# Patient Record
Sex: Male | Born: 1990 | Race: Black or African American | Hispanic: No | Marital: Single | Smoking: Current every day smoker
Health system: Southern US, Community
[De-identification: ages and names within clinical notes are randomized; demographics above are authoritative.]

---

## 2011-12-24 ENCOUNTER — Encounter (HOSPITAL_COMMUNITY): Payer: Self-pay

## 2011-12-24 ENCOUNTER — Emergency Department (HOSPITAL_COMMUNITY)
Admission: EM | Admit: 2011-12-24 | Discharge: 2011-12-24 | Disposition: A | Discharge status: Home / Self Care | Payer: BC Managed Care – PPO | Attending: Emergency Medicine | Admitting: Emergency Medicine

## 2011-12-24 ENCOUNTER — Emergency Department (HOSPITAL_COMMUNITY): Payer: BC Managed Care – PPO

## 2011-12-24 DIAGNOSIS — IMO0002 Reserved for concepts with insufficient information to code with codable children: Secondary | ICD-10-CM | POA: Insufficient documentation

## 2011-12-24 DIAGNOSIS — S40819A Abrasion of unspecified upper arm, initial encounter: Secondary | ICD-10-CM

## 2011-12-24 DIAGNOSIS — W19XXXA Unspecified fall, initial encounter: Secondary | ICD-10-CM

## 2011-12-24 DIAGNOSIS — F17200 Nicotine dependence, unspecified, uncomplicated: Secondary | ICD-10-CM | POA: Insufficient documentation

## 2011-12-24 MED ORDER — DIPHTH-ACELL PERTUSSIS-TETANUS 25-58-10 LF-MCG/0.5 IM SUSP: 0.5000 mL | Freq: Once | INTRAMUSCULAR | Status: DC

## 2011-12-24 MED ORDER — TETANUS-DIPHTH-ACELL PERTUSSIS 5-2.5-18.5 LF-MCG/0.5 IM SUSP
0.5000 mL | Freq: Once | INTRAMUSCULAR | Status: AC
  Administered 2011-12-24: 0.5 mL via INTRAMUSCULAR
  Filled 2011-12-24: qty 0.5

## 2011-12-24 MED ORDER — BACITRACIN ZINC 500 UNIT/GM EX OINT
TOPICAL_OINTMENT | CUTANEOUS | Status: AC
  Administered 2011-12-24: 15:00:00
  Filled 2011-12-24: qty 2.7

## 2011-12-24 NOTE — ED Notes (Signed)
Abrasions dressed with bacitracin/telfa/4x4/kling and tape.
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
## 2014-11-18 ENCOUNTER — Emergency Department (HOSPITAL_COMMUNITY): Payer: Self-pay

## 2014-11-18 ENCOUNTER — Emergency Department (HOSPITAL_COMMUNITY)
Admission: EM | Admit: 2014-11-18 | Discharge: 2014-11-18 | Disposition: A | Discharge status: Home / Self Care | Payer: Self-pay | Attending: Emergency Medicine | Admitting: Emergency Medicine

## 2014-11-18 ENCOUNTER — Encounter (HOSPITAL_COMMUNITY): Payer: Self-pay

## 2014-11-18 DIAGNOSIS — F1210 Cannabis abuse, uncomplicated: Secondary | ICD-10-CM | POA: Insufficient documentation

## 2014-11-18 DIAGNOSIS — F10120 Alcohol abuse with intoxication, uncomplicated: Secondary | ICD-10-CM | POA: Insufficient documentation

## 2014-11-18 DIAGNOSIS — Z720 Tobacco use: Secondary | ICD-10-CM | POA: Insufficient documentation

## 2014-11-18 DIAGNOSIS — Y9389 Activity, other specified: Secondary | ICD-10-CM | POA: Insufficient documentation

## 2014-11-18 DIAGNOSIS — R404 Transient alteration of awareness: Secondary | ICD-10-CM

## 2014-11-18 DIAGNOSIS — Y998 Other external cause status: Secondary | ICD-10-CM | POA: Insufficient documentation

## 2014-11-18 DIAGNOSIS — F10920 Alcohol use, unspecified with intoxication, uncomplicated: Secondary | ICD-10-CM

## 2014-11-18 DIAGNOSIS — Y92008 Other place in unspecified non-institutional (private) residence as the place of occurrence of the external cause: Secondary | ICD-10-CM | POA: Insufficient documentation

## 2014-11-18 DIAGNOSIS — S0081XA Abrasion of other part of head, initial encounter: Secondary | ICD-10-CM | POA: Insufficient documentation

## 2014-11-18 LAB — RAPID URINE DRUG SCREEN, HOSP PERFORMED
Amphetamines: NOT DETECTED
Barbiturates: NOT DETECTED
Benzodiazepines: NOT DETECTED
COCAINE: NOT DETECTED
OPIATES: NOT DETECTED
Tetrahydrocannabinol: POSITIVE — AB

## 2014-11-18 LAB — CBC WITH DIFFERENTIAL/PLATELET
BASOS ABS: 0 10*3/uL (ref 0.0–0.1)
BASOS PCT: 0 % (ref 0–1)
EOS ABS: 0 10*3/uL (ref 0.0–0.7)
EOS PCT: 0 % (ref 0–5)
HEMATOCRIT: 40.2 % (ref 39.0–52.0)
HEMOGLOBIN: 13.6 g/dL (ref 13.0–17.0)
Lymphocytes Relative: 15 % (ref 12–46)
Lymphs Abs: 1.4 10*3/uL (ref 0.7–4.0)
MCH: 24.2 pg — AB (ref 26.0–34.0)
MCHC: 33.8 g/dL (ref 30.0–36.0)
MCV: 71.7 fL — ABNORMAL LOW (ref 78.0–100.0)
Monocytes Absolute: 0.7 10*3/uL (ref 0.1–1.0)
Monocytes Relative: 7 % (ref 3–12)
Neutro Abs: 7.2 10*3/uL (ref 1.7–7.7)
Neutrophils Relative %: 77 % (ref 43–77)
PLATELETS: 303 10*3/uL (ref 150–400)
RBC: 5.61 MIL/uL (ref 4.22–5.81)
RDW: 14.8 % (ref 11.5–15.5)
WBC: 9.3 10*3/uL (ref 4.0–10.5)

## 2014-11-18 LAB — COMPREHENSIVE METABOLIC PANEL
ALBUMIN: 4.5 g/dL (ref 3.5–5.0)
ALK PHOS: 49 U/L (ref 38–126)
ALT: 16 U/L — ABNORMAL LOW (ref 17–63)
AST: 20 U/L (ref 15–41)
Anion gap: 9 (ref 5–15)
BILIRUBIN TOTAL: 0.3 mg/dL (ref 0.3–1.2)
BUN: 12 mg/dL (ref 6–20)
CO2: 27 mmol/L (ref 22–32)
Calcium: 9.1 mg/dL (ref 8.9–10.3)
Chloride: 105 mmol/L (ref 101–111)
Creatinine, Ser: 1.1 mg/dL (ref 0.61–1.24)
GLUCOSE: 99 mg/dL (ref 65–99)
Potassium: 3.3 mmol/L — ABNORMAL LOW (ref 3.5–5.1)
SODIUM: 141 mmol/L (ref 135–145)
Total Protein: 7.2 g/dL (ref 6.5–8.1)

## 2014-11-18 LAB — URINALYSIS, ROUTINE W REFLEX MICROSCOPIC
Bilirubin Urine: NEGATIVE
Glucose, UA: NEGATIVE mg/dL
HGB URINE DIPSTICK: NEGATIVE
KETONES UR: NEGATIVE mg/dL
LEUKOCYTES UA: NEGATIVE
NITRITE: NEGATIVE
PH: 6 (ref 5.0–8.0)
PROTEIN: NEGATIVE mg/dL
Specific Gravity, Urine: <1.005 — ABNORMAL LOW (ref 1.005–1.030)
Urobilinogen, UA: 0.2 mg/dL (ref 0.0–1.0)

## 2014-11-18 LAB — ETHANOL: Alcohol, Ethyl (B): 191 mg/dL — ABNORMAL HIGH (ref <5)

## 2014-11-18 NOTE — ED Notes (Signed)
Patient alert, getting dressed, patient call family for p/u

## 2014-11-18 NOTE — ED Provider Notes (Signed)
Patient is alert and ambulatory. No psychosis. No neuro deficits.  Donnetta Hutching, MD 11/18/14 1044

## 2014-11-18 NOTE — ED Notes (Signed)
Family at bedside. Patient asleep and snoring at this time.
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
## 2014-11-18 NOTE — ED Notes (Signed)
Patient lying on bed, eyes closed, easily aroused by touch.

## 2014-11-18 NOTE — ED Notes (Signed)
Patient ambulated around room without assistant, patient was able to tolerate one cup of water

## 2014-11-18 NOTE — ED Notes (Signed)
Father left phone number for Stepmother Merrilee Jansky [phone]) to be called once patient is alert and ready for pick-up.

## 2014-11-18 NOTE — Discharge Instructions (Signed)
Alcohol Intoxication  Alcohol intoxication occurs when the amount of alcohol that a person has consumed impairs his or her ability to mentally and physically function. Alcohol directly impairs the normal chemical activity of the brain. Drinking large amounts of alcohol can lead to changes in mental function and behavior, and it can cause many physical effects that can be harmful.   Alcohol intoxication can range in severity from mild to very severe. Various factors can affect the level of intoxication that occurs, such as the person's age, gender, weight, frequency of alcohol consumption, and the presence of other medical conditions (such as diabetes, seizures, or heart conditions). Dangerous levels of alcohol intoxication may occur when people drink large amounts of alcohol in a short period (binge drinking). Alcohol can also be especially dangerous when combined with certain prescription medicines or "recreational" drugs.  SIGNS AND SYMPTOMS  Some common signs and symptoms of mild alcohol intoxication include:  · Loss of coordination.  · Changes in mood and behavior.  · Impaired judgment.  · Slurred speech.  As alcohol intoxication progresses to more severe levels, other signs and symptoms will appear. These may include:  · Vomiting.  · Confusion and impaired memory.  · Slowed breathing.  · Seizures.  · Loss of consciousness.  DIAGNOSIS   Your health care provider will take a medical history and perform a physical exam. You will be asked about the amount and type of alcohol you have consumed. Blood tests will be done to measure the concentration of alcohol in your blood. In many places, your blood alcohol level must be lower than 80 mg/dL (0.08%) to legally drive. However, many dangerous effects of alcohol can occur at much lower levels.   TREATMENT   People with alcohol intoxication often do not require treatment. Most of the effects of alcohol intoxication are temporary, and they go away as the alcohol naturally  leaves the body. Your health care provider will monitor your condition until you are stable enough to go home. Fluids are sometimes given through an IV access tube to help prevent dehydration.   HOME CARE INSTRUCTIONS  · Do not drive after drinking alcohol.  · Stay hydrated. Drink enough water and fluids to keep your urine clear or pale yellow. Avoid caffeine.    · Only take over-the-counter or prescription medicines as directed by your health care provider.    SEEK MEDICAL CARE IF:   · You have persistent vomiting.    · You do not feel better after a few days.  · You have frequent alcohol intoxication. Your health care provider can help determine if you should see a substance use treatment counselor.  SEEK IMMEDIATE MEDICAL CARE IF:   · You become shaky or tremble when you try to stop drinking.    · You shake uncontrollably (seizure).    · You throw up (vomit) blood. This may be bright red or may look like black coffee grounds.    · You have blood in your stool. This may be bright red or may appear as a black, tarry, bad smelling stool.    · You become lightheaded or faint.    MAKE SURE YOU:   · Understand these instructions.  · Will watch your condition.  · Will get help right away if you are not doing well or get worse.  Document Released: 03/06/2005 Document Revised: 01/27/2013 Document Reviewed: 10/30/2012  [employer]® Patient Information ©2015 [employer]. This information is not intended to replace advice given to you by your health care provider. Make sure   you discuss any questions you have with your health care provider.
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
## 2016-03-27 ENCOUNTER — Encounter (HOSPITAL_COMMUNITY): Payer: Self-pay

## 2016-03-27 ENCOUNTER — Emergency Department (HOSPITAL_COMMUNITY)
Admission: EM | Admit: 2016-03-27 | Discharge: 2016-03-27 | Disposition: A | Discharge status: Home / Self Care | Payer: Self-pay | Attending: Emergency Medicine | Admitting: Emergency Medicine

## 2016-03-27 ENCOUNTER — Emergency Department (HOSPITAL_COMMUNITY): Payer: Self-pay

## 2016-03-27 DIAGNOSIS — R109 Unspecified abdominal pain: Secondary | ICD-10-CM

## 2016-03-27 DIAGNOSIS — F17200 Nicotine dependence, unspecified, uncomplicated: Secondary | ICD-10-CM | POA: Insufficient documentation

## 2016-03-27 DIAGNOSIS — E876 Hypokalemia: Secondary | ICD-10-CM | POA: Insufficient documentation

## 2016-03-27 DIAGNOSIS — R1084 Generalized abdominal pain: Secondary | ICD-10-CM | POA: Insufficient documentation

## 2016-03-27 LAB — CBC WITH DIFFERENTIAL/PLATELET
BASOS ABS: 0 10*3/uL (ref 0.0–0.1)
BASOS PCT: 1 %
EOS ABS: 0.2 10*3/uL (ref 0.0–0.7)
Eosinophils Relative: 3 %
HCT: 41.6 % (ref 39.0–52.0)
HEMOGLOBIN: 13.6 g/dL (ref 13.0–17.0)
Lymphocytes Relative: 31 %
Lymphs Abs: 1.9 10*3/uL (ref 0.7–4.0)
MCH: 23.9 pg — ABNORMAL LOW (ref 26.0–34.0)
MCHC: 32.7 g/dL (ref 30.0–36.0)
MCV: 73.1 fL — ABNORMAL LOW (ref 78.0–100.0)
Monocytes Absolute: 0.7 10*3/uL (ref 0.1–1.0)
Monocytes Relative: 11 %
NEUTROS PCT: 54 %
Neutro Abs: 3.3 10*3/uL (ref 1.7–7.7)
PLATELETS: 223 10*3/uL (ref 150–400)
RBC: 5.69 MIL/uL (ref 4.22–5.81)
RDW: 15.1 % (ref 11.5–15.5)
WBC: 6.1 10*3/uL (ref 4.0–10.5)

## 2016-03-27 LAB — URINALYSIS, ROUTINE W REFLEX MICROSCOPIC
Bilirubin Urine: NEGATIVE
GLUCOSE, UA: NEGATIVE mg/dL
HGB URINE DIPSTICK: NEGATIVE
Ketones, ur: NEGATIVE mg/dL
Leukocytes, UA: NEGATIVE
Nitrite: NEGATIVE
PROTEIN: NEGATIVE mg/dL
Specific Gravity, Urine: <1.005 — ABNORMAL LOW (ref 1.005–1.030)
pH: 6 (ref 5.0–8.0)

## 2016-03-27 LAB — COMPREHENSIVE METABOLIC PANEL
ALK PHOS: 57 U/L (ref 38–126)
ALT: 15 U/L — AB (ref 17–63)
AST: 22 U/L (ref 15–41)
Albumin: 4.7 g/dL (ref 3.5–5.0)
Anion gap: 7 (ref 5–15)
BILIRUBIN TOTAL: 0.8 mg/dL (ref 0.3–1.2)
BUN: 15 mg/dL (ref 6–20)
CALCIUM: 9.4 mg/dL (ref 8.9–10.3)
CHLORIDE: 101 mmol/L (ref 101–111)
CO2: 29 mmol/L (ref 22–32)
CREATININE: 1.23 mg/dL (ref 0.61–1.24)
Glucose, Bld: 83 mg/dL (ref 65–99)
Potassium: 3.8 mmol/L (ref 3.5–5.1)
Sodium: 137 mmol/L (ref 135–145)
TOTAL PROTEIN: 7.7 g/dL (ref 6.5–8.1)

## 2016-03-27 LAB — LIPASE, BLOOD: LIPASE: 22 U/L (ref 11–51)

## 2016-03-27 MED ORDER — IOPAMIDOL (ISOVUE-300) INJECTION 61%
100.0000 mL | Freq: Once | INTRAVENOUS | Status: AC | PRN
  Administered 2016-03-27: 100 mL via INTRAVENOUS

## 2016-03-27 MED ORDER — FAMOTIDINE IN NACL 20-0.9 MG/50ML-% IV SOLN
20.0000 mg | Freq: Once | INTRAVENOUS | Status: AC
  Administered 2016-03-27: 20 mg via INTRAVENOUS
  Filled 2016-03-27: qty 50

## 2016-03-27 MED ORDER — DICYCLOMINE HCL 10 MG/ML IM SOLN
20.0000 mg | Freq: Once | INTRAMUSCULAR | Status: AC
  Administered 2016-03-27: 20 mg via INTRAMUSCULAR
  Filled 2016-03-27: qty 2

## 2016-03-27 MED ORDER — DICYCLOMINE HCL 20 MG PO TABS: 20.0000 mg | ORAL_TABLET | Freq: Four times a day (QID) | ORAL | 0 refills | Status: AC | PRN

## 2016-03-27 MED ORDER — POTASSIUM CHLORIDE CRYS ER 20 MEQ PO TBCR
40.0000 meq | EXTENDED_RELEASE_TABLET | Freq: Once | ORAL | Status: AC
  Administered 2016-03-27: 40 meq via ORAL
  Filled 2016-03-27: qty 2

## 2016-03-27 MED ORDER — ONDANSETRON HCL 4 MG PO TABS: 4.0000 mg | ORAL_TABLET | Freq: Three times a day (TID) | ORAL | 0 refills | Status: AC | PRN

## 2016-03-27 NOTE — ED Provider Notes (Signed)
[facility]-EMERGENCY DEPT Provider Note   CSN: [id number] Arrival date & time: 03/27/16  1303     History   Chief Complaint Chief Complaint  Patient presents with  . Abdominal Pain    HPI Benjamin Henry is a 25 y.o. male.  HPI  Pt was seen at 1335.  Per pt, c/o gradual onset and persistence of constant generalized abd "pain" that began overnight last night.  Has been associated with no other symptoms.  Describes the abd pain as "cramping."  Denies N/V, no diarrhea, no fevers, no back pain, no rash, no CP/SOB, no black or blood in stools, no dysuria/hematuria, no testicular pain/swelling.      History reviewed. No pertinent past medical history.  There are no active problems to display for this patient.   History reviewed. No pertinent surgical history.     Home Medications    Prior to Admission medications   Not on File    Family History History reviewed. No pertinent family history.  Social History Social History  Substance Use Topics  . Smoking status: Current Every Day Smoker  . Smokeless tobacco: Not on file  . Alcohol use Yes     Comment: occ     Allergies   Tylenol [acetaminophen]   Review of Systems Review of Systems ROS: Statement: All systems negative except as marked or noted in the HPI; Constitutional: Negative for fever and chills. ; ; Eyes: Negative for eye pain, redness and discharge. ; ; ENMT: Negative for ear pain, hoarseness, nasal congestion, sinus pressure and sore throat. ; ; Cardiovascular: Negative for chest pain, palpitations, diaphoresis, dyspnea and peripheral edema. ; ; Respiratory: Negative for cough, wheezing and stridor. ; ; Gastrointestinal: +abd pain. Negative for nausea, vomiting, diarrhea, blood in stool, hematemesis, jaundice and rectal bleeding. . ; ; Genitourinary: Negative for dysuria, flank pain and hematuria. ; ; Musculoskeletal: Negative for back pain and neck pain. Negative for swelling and trauma.; ; Skin: Negative for pruritus,  rash, abrasions, blisters, bruising and skin lesion.; ; Neuro: Negative for headache, lightheadedness and neck stiffness. Negative for weakness, altered level of consciousness, altered mental status, extremity weakness, paresthesias, involuntary movement, seizure and syncope.      Physical Exam Updated Vital Signs BP 113/64 (BP Location: Left Arm)   Pulse 74   Temp 98.5 F (36.9 C) (Oral)   Resp 16   Ht 5\' 7"  (1.702 m)   Wt 160 lb (72.6 kg)   SpO2 100%   BMI 25.06 kg/m   Physical Exam 1340: Physical examination:  Nursing notes reviewed; Vital signs and O2 SAT reviewed;  Constitutional: Well developed, Well nourished, Well hydrated, In no acute distress; Head:  Normocephalic, atraumatic; Eyes: EOMI, PERRL, No scleral icterus; ENMT: Mouth and pharynx normal, Mucous membranes moist; Neck: Supple, Full range of motion, No lymphadenopathy; Cardiovascular: Regular rate and rhythm, No murmur, rub, or gallop; Respiratory: Breath sounds clear & equal bilaterally, No rales, rhonchi, wheezes.  Speaking full sentences with ease, Normal respiratory effort/excursion; Chest: Nontender, Movement normal; Abdomen: Soft, +diffuse tenderness to palp. No rebound or guarding. Nondistended, Normal bowel sounds; Genitourinary: No CVA tenderness; Extremities: Pulses normal, No tenderness, No edema, No calf edema or asymmetry.; Neuro: AA&Ox3, Major CN grossly intact.  Speech clear. No gross focal motor or sensory deficits in extremities.; Skin: Color normal, Warm, Dry.   ED Treatments / Results  Labs (all labs ordered are listed, but only abnormal results are displayed)   EKG  EKG Interpretation None  Radiology No results found.  Procedures Procedures (including critical care time)  Medications Ordered in ED Medications  dicyclomine (BENTYL) injection 20 mg (not administered)     Initial Impression / Assessment and Plan / ED Course  I have reviewed the triage vital signs and the nursing  notes.  Pertinent labs & imaging results that were available during my care of the patient were reviewed by me and considered in my medical decision making (see chart for details).  MDM Reviewed: previous chart, vitals and nursing note Reviewed previous: labs Interpretation: labs and CT scan   Results for orders placed or performed during the hospital encounter of 03/27/16  Urinalysis, Routine w reflex microscopic  Result Value Ref Range   Color, Urine YELLOW YELLOW   APPearance CLEAR CLEAR   Specific Gravity, Urine <1.005 (L) 1.005 - 1.030   pH 6.0 5.0 - 8.0   Glucose, UA NEGATIVE NEGATIVE mg/dL   Hgb urine dipstick NEGATIVE NEGATIVE   Bilirubin Urine NEGATIVE NEGATIVE   Ketones, ur NEGATIVE NEGATIVE mg/dL   Protein, ur NEGATIVE NEGATIVE mg/dL   Nitrite NEGATIVE NEGATIVE   Leukocytes, UA NEGATIVE NEGATIVE  Comprehensive metabolic panel  Result Value Ref Range   Sodium 137 135 - 145 mmol/L   Potassium 3.8 3.5 - 5.1 mmol/L   Chloride 101 101 - 111 mmol/L   CO2 29 22 - 32 mmol/L   Glucose, Bld 83 65 - 99 mg/dL   BUN 15 6 - 20 mg/dL   Creatinine, Ser 4.09 0.61 - 1.24 mg/dL   Calcium 9.4 8.9 - 81.1 mg/dL   Total Protein 7.7 6.5 - 8.1 g/dL   Albumin 4.7 3.5 - 5.0 g/dL   AST 22 15 - 41 U/L   ALT 15 (L) 17 - 63 U/L   Alkaline Phosphatase 57 38 - 126 U/L   Total Bilirubin 0.8 0.3 - 1.2 mg/dL   GFR calc non Af Amer >60 >60 mL/min   GFR calc Af Amer >60 >60 mL/min   Anion gap 7 5 - 15  CBC with Differential  Result Value Ref Range   WBC 6.1 4.0 - 10.5 K/uL   RBC 5.69 4.22 - 5.81 MIL/uL   Hemoglobin 13.6 13.0 - 17.0 g/dL   HCT 91.4 78.2 - 95.6 %   MCV 73.1 (L) 78.0 - 100.0 fL   MCH 23.9 (L) 26.0 - 34.0 pg   MCHC 32.7 30.0 - 36.0 g/dL   RDW 21.3 08.6 - 57.8 %   Platelets 223 150 - 400 K/uL   Neutrophils Relative % 54 %   Neutro Abs 3.3 1.7 - 7.7 K/uL   Lymphocytes Relative 31 %   Lymphs Abs 1.9 0.7 - 4.0 K/uL   Monocytes Relative 11 %   Monocytes Absolute 0.7 0.1 - 1.0  K/uL   Eosinophils Relative 3 %   Eosinophils Absolute 0.2 0.0 - 0.7 K/uL   Basophils Relative 1 %   Basophils Absolute 0.0 0.0 - 0.1 K/uL  Lipase, blood  Result Value Ref Range   Lipase 22 11 - 51 U/L   Ct Abdomen Pelvis W Contrast Result Date: 03/27/2016 CLINICAL DATA:  Lower abdominal pain starting this morning EXAM: CT ABDOMEN AND PELVIS WITH CONTRAST TECHNIQUE: Multidetector CT imaging of the abdomen and pelvis was performed using the standard protocol following bolus administration of intravenous contrast. CONTRAST:  ISOVUE-300 IOPAMIDOL (ISOVUE-300) INJECTION 61% COMPARISON:  None. FINDINGS: Lower chest: Unremarkable Hepatobiliary: Mildly contracted gallbladder. Otherwise unremarkable. Pancreas: Unremarkable Spleen: Unremarkable Adrenals/Urinary Tract:  Unremarkable Stomach/Bowel: Unremarkable.  Appendix normal. Vascular/Lymphatic: Unremarkable Reproductive: Unremarkable Other: Unremarkable Musculoskeletal: Diffuse disc bulges at L4-5 and L5-S1, with crowding of the neural foramina bilaterally at both levels, potentially reflecting mild impingement. IMPRESSION: 1. A specific cause for the patient's lower abdominal pain is not identified. The appendix appears normal. 2. 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 Electronically Signed   By: Gaylyn RongWalter  Liebkemann M.D.   On: 03/27/2016 15:48    1635:  Pt has tol PO well while in the ED without N/V.  No stooling while in the ED.  Abd benign, VSS. Feels better and wants to go home now. Dx and testing d/w pt and family.  Questions answered.  Verb understanding, agreeable to d/c home with outpt f/u.      Final Clinical Impressions(s) / ED Diagnoses   Final diagnoses:  None    New Prescriptions New Prescriptions   No medications on file     Samuel JesterKathleen Dorise Gangi, DO 03/29/16 1545

## 2016-03-27 NOTE — Discharge Instructions (Signed)
Take the prescriptions as directed.  Increase your fluid intake (ie:  Gatoraide) for the next few days.  Eat a bland diet and advance to your regular diet slowly as you can tolerate it.  Call your regular medical doctor today to schedule a follow up appointment this week.  Return to the Emergency Department immediately if not improving (or even worsening) despite taking the medicines as prescribed, any black or bloody stool or vomit, if you develop a fever over "101," or for any other concerns.

## 2016-03-27 NOTE — ED Triage Notes (Signed)
Pt reports lower abd pain since this morning without n/v/d.  Pt alert and oriented.

## 2018-05-16 ENCOUNTER — Emergency Department (HOSPITAL_COMMUNITY)
Admission: EM | Admit: 2018-05-16 | Discharge: 2018-05-16 | Disposition: A | Discharge status: Home / Self Care | Payer: BLUE CROSS/BLUE SHIELD | Attending: Emergency Medicine | Admitting: Emergency Medicine

## 2018-05-16 ENCOUNTER — Other Ambulatory Visit: Payer: Self-pay

## 2018-05-16 ENCOUNTER — Emergency Department (HOSPITAL_COMMUNITY): Payer: BLUE CROSS/BLUE SHIELD

## 2018-05-16 ENCOUNTER — Encounter (HOSPITAL_COMMUNITY): Payer: Self-pay | Admitting: Emergency Medicine

## 2018-05-16 DIAGNOSIS — F17210 Nicotine dependence, cigarettes, uncomplicated: Secondary | ICD-10-CM | POA: Diagnosis not present

## 2018-05-16 DIAGNOSIS — Z79899 Other long term (current) drug therapy: Secondary | ICD-10-CM | POA: Diagnosis not present

## 2018-05-16 DIAGNOSIS — R079 Chest pain, unspecified: Secondary | ICD-10-CM | POA: Diagnosis present

## 2018-05-16 DIAGNOSIS — R0789 Other chest pain: Secondary | ICD-10-CM | POA: Insufficient documentation

## 2018-05-16 DIAGNOSIS — R0781 Pleurodynia: Secondary | ICD-10-CM

## 2018-05-16 NOTE — Discharge Instructions (Addendum)
Your pain will be hard to treat because it only happens once a week. Recheck if you get a constant pain, shortness of breath or fever.

## 2018-05-16 NOTE — ED Provider Notes (Signed)
[facility] EMERGENCY DEPARTMENT Provider Note   CSN: [id number] Arrival date & time: 05/16/18  0202  Time seen 3:45 AM   History   Chief Complaint Chief Complaint  Patient presents with  . Back Pain    HPI Benjamin Henry is a 27 y.o. male.  HPI patient states he gets a sharp pain in his left posterior chest when he breathes deep.  He states it started 3 to 4 months ago and it happens about once a week.  It will last a second and then he is fine.  He denies cough, or fever.  His last episode was at 10:30 PM tonight.  He came to the ED at 2 AM.  He states he smokes a half of pack of cigarettes a day.  He denies history of hypertension or diabetes.  He denies any family history of coronary artery disease, DVT or PE.   PCP Patient, No Pcp Per   History reviewed. No pertinent past medical history.  There are no active problems to display for this patient.   History reviewed. No pertinent surgical history.      Home Medications    Prior to Admission medications   Medication Sig Start Date End Date Taking? Authorizing Provider  dicyclomine (BENTYL) 20 MG tablet Take 1 tablet (20 mg total) by mouth every 6 (six) hours as needed for spasms (abdominal cramping). 03/27/16   Samuel Jester, DO  ondansetron (ZOFRAN) 4 MG tablet Take 1 tablet (4 mg total) by mouth every 8 (eight) hours as needed for nausea or vomiting. 03/27/16   Samuel Jester, DO    Family History History reviewed. No pertinent family history.  Social History Social History   Tobacco Use  . Smoking status: Current Every Day Smoker  . Smokeless tobacco: Never Used  Substance Use Topics  . Alcohol use: Yes    Comment: occ  . Drug use: No  smokes 1/2 ppd   Allergies   Tylenol [acetaminophen]   Review of Systems Review of Systems  All other systems reviewed and are negative.    Physical Exam Updated Vital Signs BP 106/71 (BP Location: Left Arm)   Pulse 70   Temp 97.7 F (36.5 C) (Oral)    Resp 17   Ht 5\' 7"  (1.702 m)   Wt 81.6 kg   SpO2 98%   BMI 28.19 kg/m   Physical Exam  Constitutional: He is oriented to person, place, and time. He appears well-developed and well-nourished. No distress.  Patient is asleep in the stretcher with his girlfriend.  She was asked to get out of the stretcher.  Patient remains sleepy and hard to understand because he falls asleep while talking.  HENT:  Head: Normocephalic and atraumatic.  Right Ear: External ear normal.  Left Ear: External ear normal.  Nose: Nose normal.  Eyes: Conjunctivae and EOM are normal.  Neck: Normal range of motion.  Cardiovascular: Normal rate, regular rhythm and normal heart sounds.  Pulmonary/Chest: Effort normal and breath sounds normal. No stridor. No respiratory distress. He has no wheezes. He has no rales.     He exhibits no tenderness.  Area of pain noted  Musculoskeletal: Normal range of motion.  Neurological: He is oriented to person, place, and time. No cranial nerve deficit.  Skin: Skin is warm and dry. No rash noted.  Psychiatric: His speech is delayed. He is slowed.  Nursing note and vitals reviewed.    ED Treatments / Results  Labs (all labs ordered are listed,  but only abnormal results are displayed) Labs Reviewed - No data to display  EKG None  Radiology Dg Chest 2 View  Result Date: 05/16/2018 CLINICAL DATA:  27 year old male with pleuritic left chest pain. EXAM: CHEST - 2 VIEW COMPARISON:  Chest radiograph dated 11/18/2014 FINDINGS: The heart size and mediastinal contours are within normal limits. Both lungs are clear. The visualized skeletal structures are unremarkable. IMPRESSION: No active cardiopulmonary disease. Electronically Signed   By: Elgie CollardArash  Radparvar M.D.   On: 05/16/2018 05:00    Procedures Procedures (including critical care time)  Medications Ordered in ED Medications - No data to display   Initial Impression / Assessment and Plan / ED Course  I have reviewed  the triage vital signs and the nursing notes.  Pertinent labs & imaging results that were available during my care of the patient were reviewed by me and considered in my medical decision making (see chart for details).     Chest x-ray was done to make sure there is nothing underlying acute in his chest.  I talked to the patient this would be hard to treat because he only has an episode about once a week and it only last a second.  He can try to take Motrin however his pain is so sporadic I do not know if that would help.  I assured him that pain only lasts a second is probably nothing serious underlying it.  However if he gets more constant pain, fever, or coughing he should be reevaluated.  Final Clinical Impressions(s) / ED Diagnoses   Final diagnoses:  Pleuritic chest pain    ED Discharge Orders    None     Plan discharge  Devoria AlbeIva Conley Pawling, MD, Concha PyoFACEP    Karista Aispuro, MD 05/16/18 [id number]

## 2018-05-16 NOTE — ED Triage Notes (Signed)
Pt c/o mid back pain when he inhales x a few mos, denies seeing PCP or taking OTC meds

## 2019-07-19 ENCOUNTER — Other Ambulatory Visit: Payer: Self-pay

## 2019-07-19 ENCOUNTER — Ambulatory Visit: Payer: Self-pay | Attending: Internal Medicine

## 2019-07-19 DIAGNOSIS — Z20822 Contact with and (suspected) exposure to covid-19: Secondary | ICD-10-CM | POA: Insufficient documentation

## 2019-07-20 LAB — NOVEL CORONAVIRUS, NAA: SARS-CoV-2, NAA: NOT DETECTED

## 2020-03-26 IMAGING — DX DG CHEST 2V
2 series · 2 of 2 positions shown · non-contrast
Comparison: Chest radiograph dated 11/18/2014

CLINICAL DATA: 27-year-old male with pleuritic left chest pain.

EXAM:
CHEST - 2 VIEW

[chest pa]
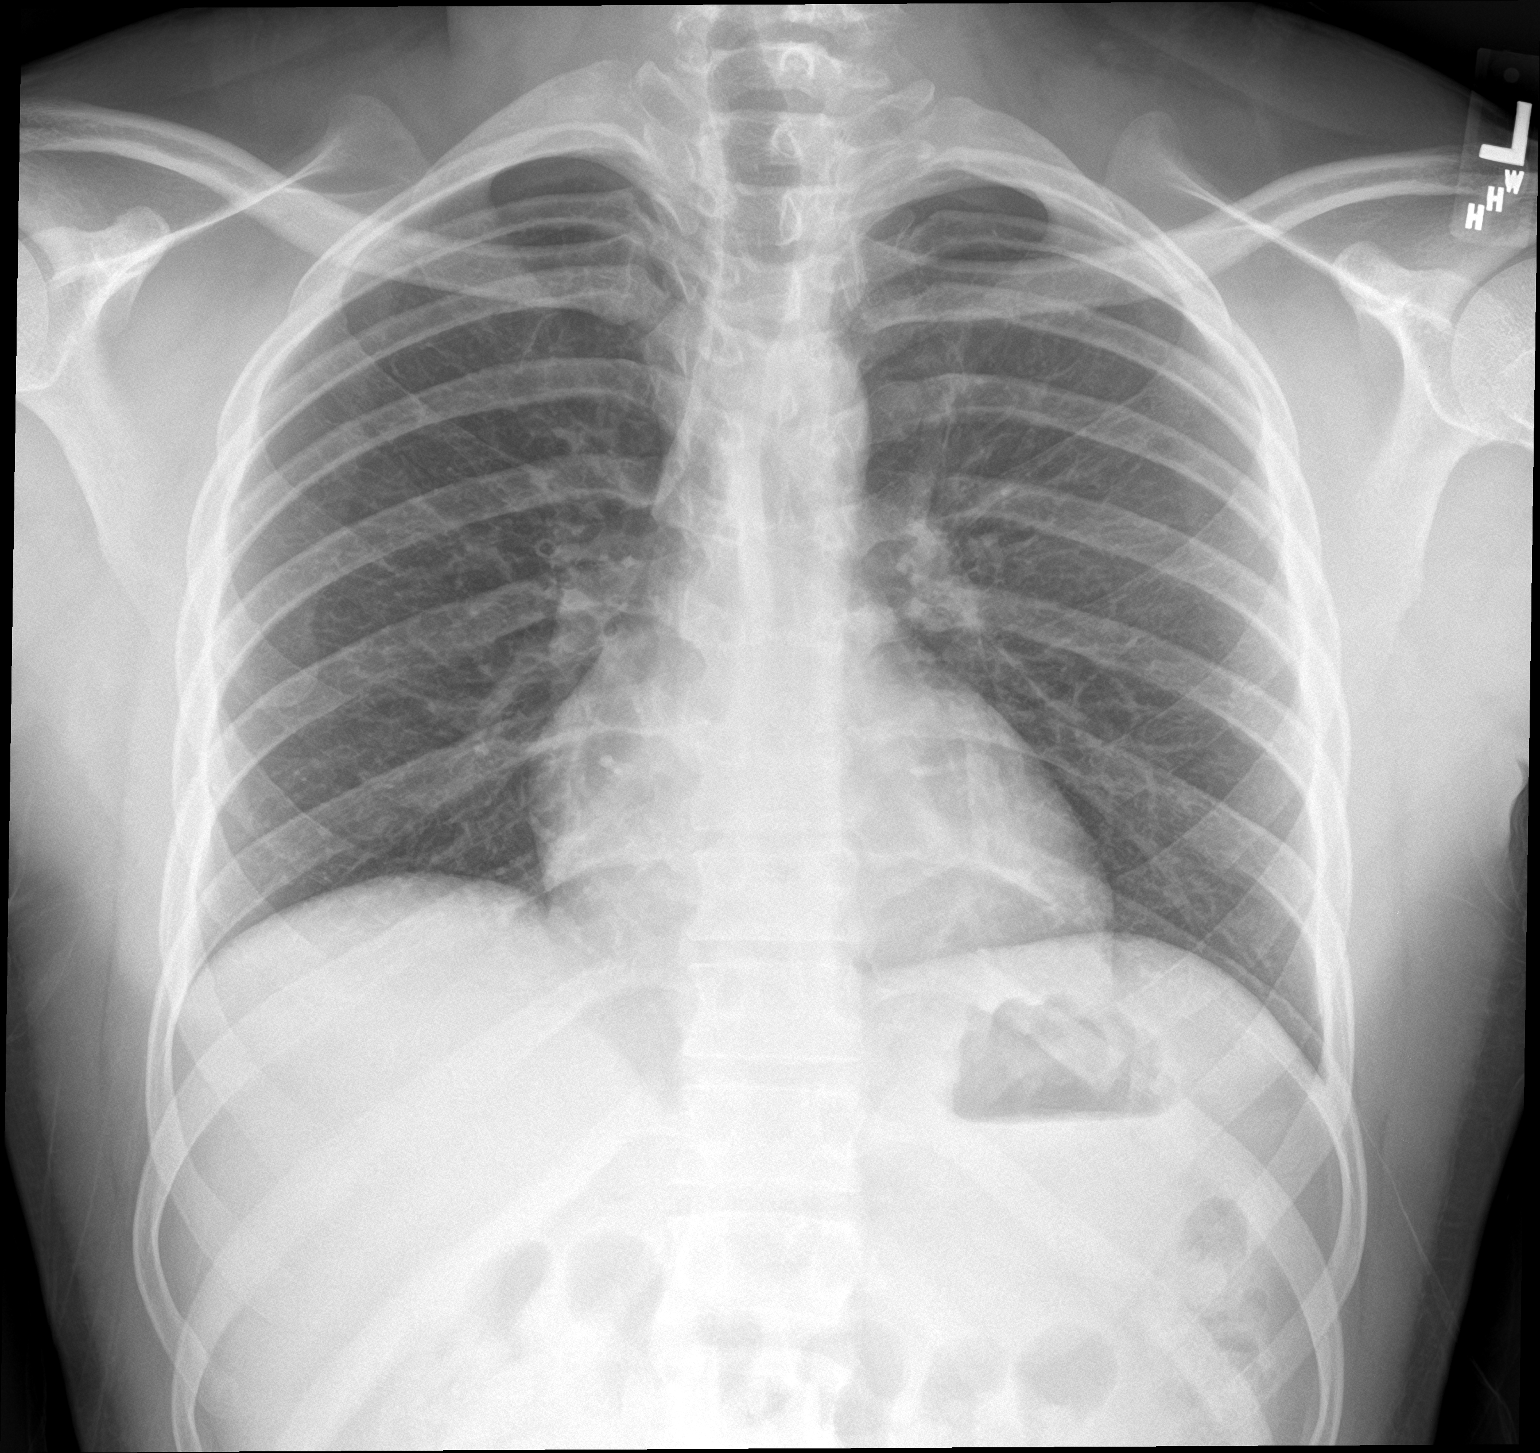

[chest lat]
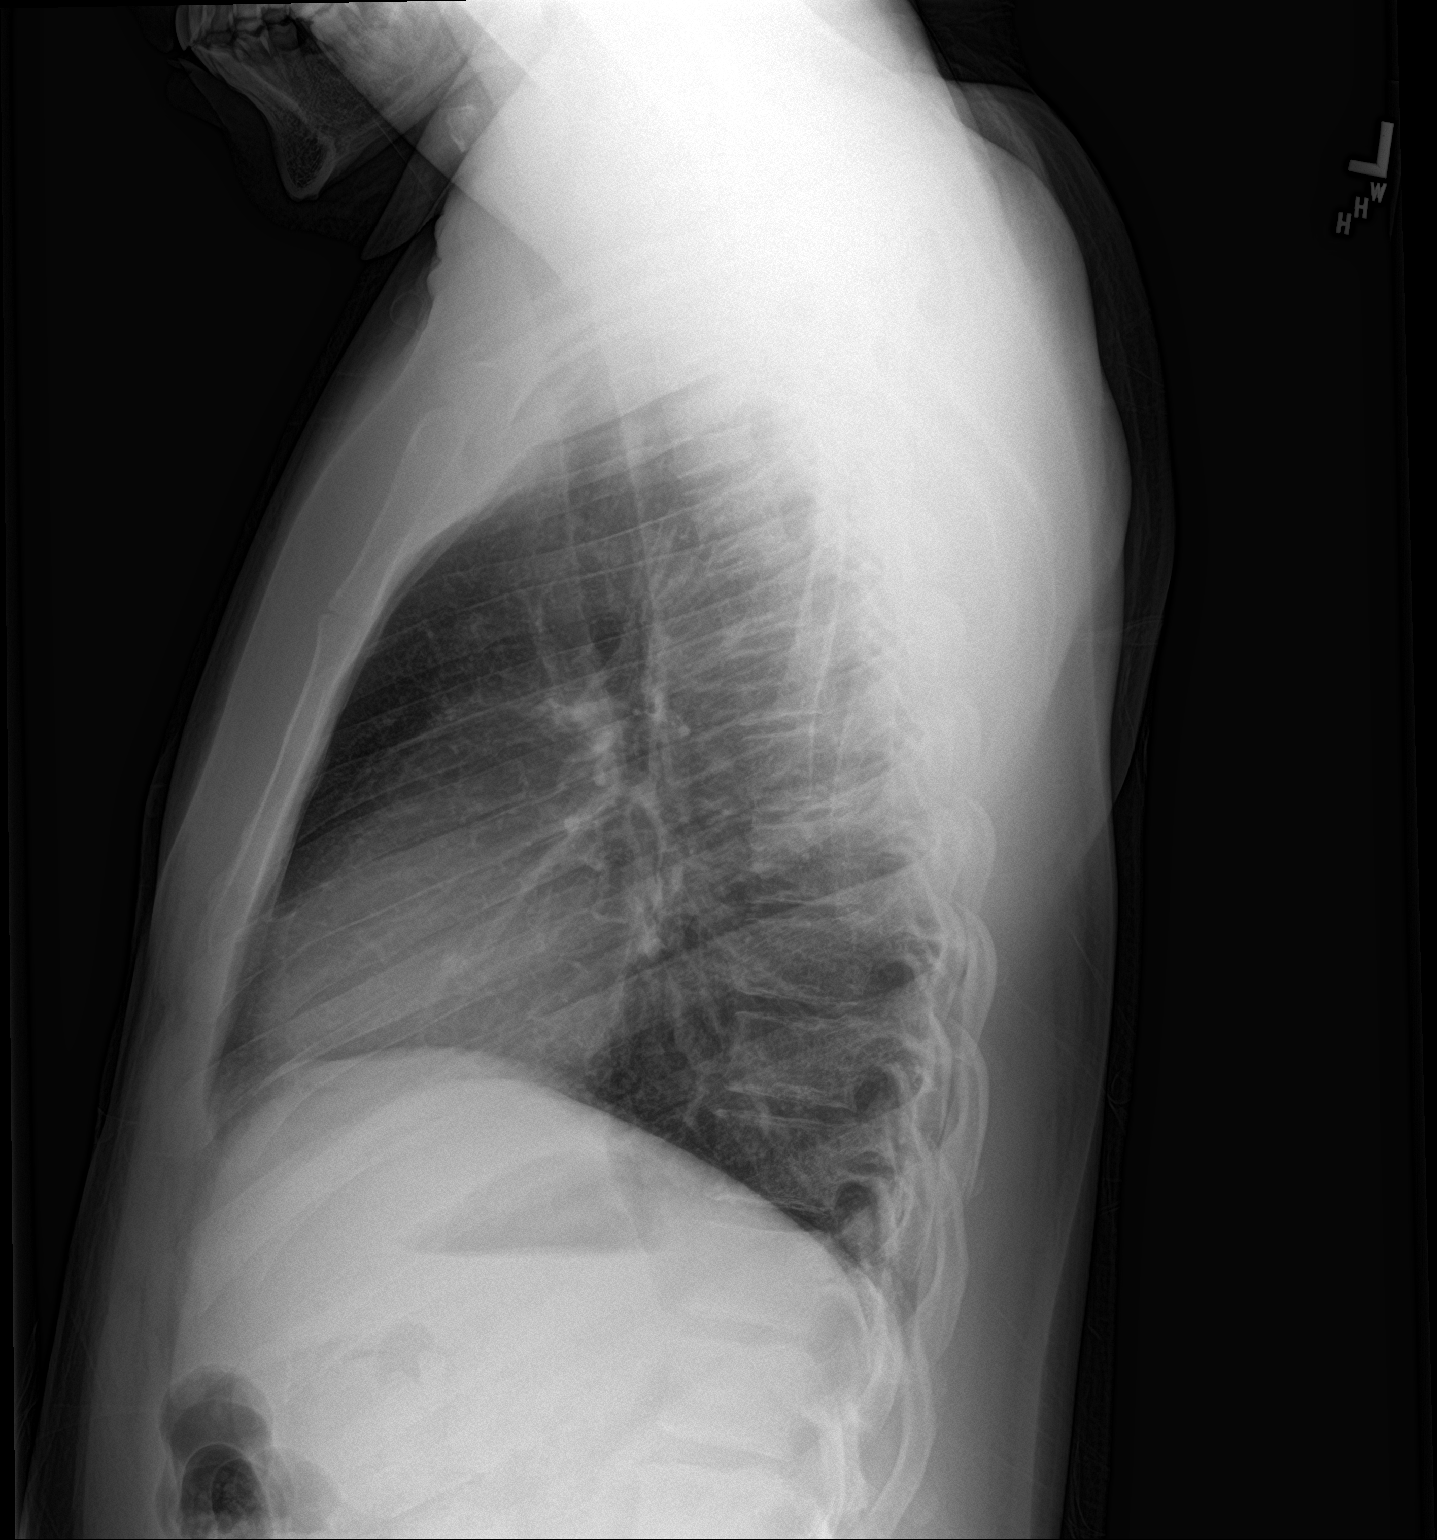

[2 of 2 positions shown; findings below may reference images not displayed]

FINDINGS: The heart size and mediastinal contours are within normal limits.
Both lungs are clear. The visualized skeletal structures are
unremarkable.
IMPRESSION: No active cardiopulmonary disease.

## 2021-07-08 ENCOUNTER — Other Ambulatory Visit: Payer: Self-pay

## 2021-07-08 ENCOUNTER — Encounter (HOSPITAL_COMMUNITY): Payer: Self-pay

## 2021-07-08 ENCOUNTER — Emergency Department (HOSPITAL_COMMUNITY)
Admission: EM | Admit: 2021-07-08 | Discharge: 2021-07-08 | Disposition: A | Discharge status: Home / Self Care | Payer: Self-pay | Attending: Emergency Medicine | Admitting: Emergency Medicine

## 2021-07-08 DIAGNOSIS — F329 Major depressive disorder, single episode, unspecified: Secondary | ICD-10-CM | POA: Insufficient documentation

## 2021-07-08 DIAGNOSIS — Y906 Blood alcohol level of 120-199 mg/100 ml: Secondary | ICD-10-CM | POA: Insufficient documentation

## 2021-07-08 DIAGNOSIS — R45851 Suicidal ideations: Secondary | ICD-10-CM | POA: Insufficient documentation

## 2021-07-08 LAB — COMPREHENSIVE METABOLIC PANEL
ALT: 27 U/L (ref 0–44)
AST: 21 U/L (ref 15–41)
Albumin: 4.3 g/dL (ref 3.5–5.0)
Alkaline Phosphatase: 54 U/L (ref 38–126)
Anion gap: 11 (ref 5–15)
BUN: 10 mg/dL (ref 6–20)
CO2: 24 mmol/L (ref 22–32)
Calcium: 9 mg/dL (ref 8.9–10.3)
Chloride: 107 mmol/L (ref 98–111)
Creatinine, Ser: 0.93 mg/dL (ref 0.61–1.24)
GFR, Estimated: >60 mL/min (ref >60)
Glucose, Bld: 86 mg/dL (ref 70–99)
Potassium: 3.8 mmol/L (ref 3.5–5.1)
Sodium: 142 mmol/L (ref 135–145)
Total Bilirubin: 0.3 mg/dL (ref 0.3–1.2)
Total Protein: 7.1 g/dL (ref 6.5–8.1)

## 2021-07-08 LAB — CBC WITH DIFFERENTIAL/PLATELET
Abs Immature Granulocytes: 0.02 10*3/uL (ref 0.00–0.07)
Basophils Absolute: 0 10*3/uL (ref 0.0–0.1)
Basophils Relative: 1 %
Eosinophils Absolute: 0.1 10*3/uL (ref 0.0–0.5)
Eosinophils Relative: 1 %
HCT: 39.7 % (ref 39.0–52.0)
Hemoglobin: 12.8 g/dL — ABNORMAL LOW (ref 13.0–17.0)
Immature Granulocytes: 0 %
Lymphocytes Relative: 41 %
Lymphs Abs: 3.4 10*3/uL (ref 0.7–4.0)
MCH: 24.4 pg — ABNORMAL LOW (ref 26.0–34.0)
MCHC: 32.2 g/dL (ref 30.0–36.0)
MCV: 75.6 fL — ABNORMAL LOW (ref 80.0–100.0)
Monocytes Absolute: 0.6 10*3/uL (ref 0.1–1.0)
Monocytes Relative: 7 %
Neutro Abs: 4.2 10*3/uL (ref 1.7–7.7)
Neutrophils Relative %: 50 %
Platelets: 364 10*3/uL (ref 150–400)
RBC: 5.25 MIL/uL (ref 4.22–5.81)
RDW: 16.5 % — ABNORMAL HIGH (ref 11.5–15.5)
WBC: 8.3 10*3/uL (ref 4.0–10.5)
nRBC: 0 % (ref 0.0–0.2)

## 2021-07-08 LAB — ETHANOL: Alcohol, Ethyl (B): 141 mg/dL — ABNORMAL HIGH (ref <10)

## 2021-07-08 NOTE — ED Notes (Signed)
Pt has been psych clear.  Belonging returned to pt.  Denies any si/hi at  present.  Safety plan in place per behavior health.

## 2021-07-08 NOTE — ED Triage Notes (Signed)
Pt to ED by [facility] from home for mental health evaluation. Pt states he recently lost everything and cant get a job to support his family. States he has recently had legal issues and feels hopeless. Arrives A+O, VSS, NADN, pt cooperative in triage.
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
## 2021-07-08 NOTE — ED Provider Notes (Signed)
[facility] EMERGENCY DEPARTMENT Provider Note   CSN: [id number] Arrival date & time: 07/08/21  1624     History  Chief Complaint  Patient presents with   Mental Health Problem    Benjamin Henry is a 31 y.o. male.  Patient is a 31 year old male presenting for evaluation of mental health issues.  Patient reports financial difficulties, legal issues that have contributed to his feeling hopeless.  He feels suicidal, but without plan.  Patient was brought here by [facility] Department for evaluation of this.  The history is provided by the patient.  Mental Health Problem Presenting symptoms: depression   Patient accompanied by:  Law enforcement Degree of incapacity (severity):  Moderate Onset quality:  Gradual Timing:  Constant Progression:  Worsening     Home Medications Prior to Admission medications   Medication Sig Start Date End Date Taking? Authorizing Provider  dicyclomine (BENTYL) 20 MG tablet Take 1 tablet (20 mg total) by mouth every 6 (six) hours as needed for spasms (abdominal cramping). 03/27/16   Samuel Jester, DO  ondansetron (ZOFRAN) 4 MG tablet Take 1 tablet (4 mg total) by mouth every 8 (eight) hours as needed for nausea or vomiting. 03/27/16   Samuel Jester, DO      Allergies    Tylenol [acetaminophen]    Review of Systems   Review of Systems  All other systems reviewed and are negative.  Physical Exam Updated Vital Signs BP (!) 142/88 (BP Location: Right Arm)    Pulse (!) 102    Temp 98.4 F (36.9 C)    Resp 18    Ht 5\' 7"  (1.702 m)    Wt 81.6 kg    SpO2 94%    BMI 28.18 kg/m  Physical Exam Vitals and nursing note reviewed.  Constitutional:      General: He is not in acute distress.    Appearance: He is well-developed. He is not diaphoretic.  HENT:     Head: Normocephalic and atraumatic.  Cardiovascular:     Rate and Rhythm: Normal rate and regular rhythm.     Heart sounds: No murmur heard.   No friction rub.  Pulmonary:      Effort: Pulmonary effort is normal. No respiratory distress.     Breath sounds: Normal breath sounds. No wheezing or rales.  Abdominal:     General: Bowel sounds are normal. There is no distension.     Palpations: Abdomen is soft.     Tenderness: There is no abdominal tenderness.  Musculoskeletal:        General: Normal range of motion.     Cervical back: Normal range of motion and neck supple.  Skin:    General: Skin is warm and dry.  Neurological:     Mental Status: He is alert and oriented to person, place, and time.     Coordination: Coordination normal.  Psychiatric:        Attention and Perception: Attention normal.        Mood and Affect: Mood normal.        Speech: Speech normal.        Behavior: Behavior is withdrawn.        Thought Content: Thought content includes suicidal ideation. Thought content does not include homicidal ideation. Thought content does not include homicidal or suicidal plan.        Cognition and Memory: Cognition normal.    ED Results / Procedures / Treatments   Labs (all labs ordered are listed, but  only abnormal results are displayed) Labs Reviewed  COMPREHENSIVE METABOLIC PANEL  ETHANOL  CBC WITH DIFFERENTIAL/PLATELET  URINALYSIS, ROUTINE W REFLEX MICROSCOPIC  RAPID URINE DRUG SCREEN, HOSP PERFORMED    EKG None  Radiology No results found.  Procedures Procedures    Medications Ordered in ED Medications - No data to display  ED Course/ Medical Decision Making/ A&P  Patient presenting with complaints of depression and suicidal ideation due to recent life stressors.  Patient's medical work-up unremarkable and appears medically cleared.  Patient to undergo TTS consultation with disposition pending their recommendations.  Final Clinical Impression(s) / ED Diagnoses Final diagnoses:  None    Rx / DC Orders ED Discharge Orders     None         Geoffery Lyons, MD 07/09/21 [phone]

## 2021-07-08 NOTE — ED Provider Notes (Signed)
Patient has been psych cleared by our services.  I was the attending physician present during this evaluation.  Please refer to their note for full details.  Father has been contacted, patient has a safe disposition.  Patient at this time appears safe and stable for discharge and close outpatient follow up. Discharge plan and strict return to ED precautions discussed, patient verbalizes understanding and agreement.   Rozelle Logan, DO 07/08/21 1146

## 2021-10-08 DEATH — deceased
